# Patient Record
Sex: Female | Born: 1935 | Race: White | Hispanic: No | Marital: Married
Health system: Midwestern US, Academic
[De-identification: ages and names within clinical notes are randomized; demographics above are authoritative.]

---

## 2016-10-05 MED ORDER — LEVETIRACETAM 500 MG PO TB24
ORAL_TABLET | Freq: Every day | ORAL | 4 refills | 90.00000 days | Status: DC
Start: 2016-10-05 — End: 2017-04-25

## 2017-04-25 ENCOUNTER — Encounter: Admit: 2017-04-25 | Discharge: 2017-04-25 | Payer: MEDICARE

## 2017-04-25 ENCOUNTER — Ambulatory Visit: Admit: 2017-04-25 | Discharge: 2017-04-26 | Payer: MEDICARE

## 2017-04-25 DIAGNOSIS — C189 Malignant neoplasm of colon, unspecified: ICD-10-CM

## 2017-04-25 DIAGNOSIS — G40909 Epilepsy, unspecified, not intractable, without status epilepticus: Principal | ICD-10-CM

## 2017-04-25 MED ORDER — LEVETIRACETAM 500 MG PO TB24
500 mg | ORAL_TABLET | Freq: Every day | ORAL | 3 refills | 90.00000 days | Status: AC
Start: 2017-04-25 — End: 2018-05-15

## 2017-04-25 NOTE — Progress Notes
Subjective:       History of Present Illness  Alicia Kramer is a 82 y.o. female who is here today for follow up of undefined epilepsy. Since the last visit 02/18/2016, she continues to be seizure free on levetiracetam XR 500mg  qHS.  She is pleased with her care and denies any medication side effects.    MEDICATION SIDE EFFECTS:  None    REVIEW OF LABS/IMAGING:  1. [place] EEG (08/29/14)???- normal (report reviewed)  2. 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, both of unclear clinical significance???(imaging reviewed)       Review of Systems  The following systems were reviewed and were negative except as mentioned elsewhere:      Constitutional:  No fever/chills/sweat, weight loss, tiredness/fatigue, or poor appetite  Eyes:  No reduced vision or blurriness, double vision, or droopy eyelids  ENT:  No hearing loss, ringing in the ears, vertigo, or hoarseness  Cardiovascular:  No chest pain/angina or palpitations  Respiratory:  No shortness of breath or cough  Gastrointestinal:  No abdominal pain, nausea and/or vomiting, diarrhea, or constipation  Genitourinary:  No pain with urination, excessive urination, or incontinence  Musculoskeletal:  No back pain, neck pain, joint pain/redness/swelling, or myalgia  Skin:  No jaundice, rash, or change in sweating  Hematologic/ Lymphatic:  No anemia, easy bruising, bleeding, or enlarged lymph nodes  Endocrine:  No temperature intolerance, polyuria, or polydipsia  Allergic/ Immunological:  No severe allergic reaction  Psychiatric:  No depression or anxiety    Objective:         ??? CALCIUM PO Take 600 mg by mouth daily.   ??? levETIRAcetam XR(+) (KEPPRA XR) 500 mg Tb24 tablet TAKE TWO TABLETS BY MOUTH ONCE DAILY   ??? MULTIVITAMINS WITH FLUORIDE (MULTI-VITAMIN PO) Take  by mouth daily.   ??? other medication 1 Dose. I Caps BID   ??? vit A/C/E ac/ZnOx/cupric oxide (EYE VITAMIN AND MINERALS PO) Take  by mouth daily.     Vitals: 04/25/17 1005 04/25/17 1009   BP: 167/76 162/65   Pulse: 71    SpO2: 98%    Weight: 70.7 kg (155 lb 12.8 oz)    Height: 154.9 cm (61)      Body mass index is 29.44 kg/m???.     Physical Exam  The patient is pleasant and cooperative with the exam.  She is not in acute distress.  The head is normocephalic without evidence of previous trauma.  Oropharynx is clear.  Lungs are clear to auscultation bilaterally.  Cardiac exam reveals a regular rate and rhythm.  Abdomen is soft with active bowel sounds.  Pulses are preserved in all four extremities.  Skin exhibits normal temperature and showed no rash.    Mental Status: The patient is attentive, alert, and oriented to person, time and place.  Language comprehension, naming, concentration, and recent/remote memory are intact.  Speech is fluent.    Cranial Nerves: Pupils are equal and reactive to light bilaterally.  Visual fields are intact to confrontation testing.  There is no papilledema.   Extraocular movements are intact without nystagmus.  Facial sensation and strength are normal.  Hearing is intact to finger rub.  Tongue and uvular are in the midline.  Shoulder shrug is normal bilaterally.      Motor: Muscle bulk is normal.  Tone is normal in all extremities.  Motor power is normal in the bilateral deltoids, biceps, triceps, wrist extensors, grip, iliopsoas, quads, hamstrings,  gastrocnemius, and tibialis anterior.  There is no pronator drift or fixation.  Tendon reflexes are 2+ diffusely.  Babinski maneuver elicits flexor response bilaterally.     Sensory: Sensation is intact to pinprick, vibration, and light touch.    Cerebellar:  Finger to nose test shows no dysmetria.  Rapid alternating movements are normal.  There is no abnormal movement.      Gait: Normal stance, arm swing, and stride length.  Patient is able to toe, heel, and tandem walk.  No ataxia is noted.       Assessment and Plan:  CLINICAL SUMMARY Ms. Tedesco is a pleasant 82 year old woman with well controlled undefined epilepsy.  We will continue the levetiracetam XR 500mg  qHS.    EPILEPSY CLASSIFICATION  Epileptogenic zone:                  Unknown  Epileptic Seizures:                    Generalized tonic clonic seizure  Etiology:                                     Unknown  Related Medical Conditions:    None    MANAGEMENT AND PLAN  During the visit I discussed my impression, recommended diagnostic studies, prognosis, risks and benefits of management, instructions for management, and importance of compliance.  After a discussion, the patient agrees with the plan.  Total time for this office visit was 15 minutes, of which 10 minutes was spent counseling the patient in regarding her undefined epilepsy.    RECOMMENDATIONS  1.  Continue levetiracetam XR 500mg  qHS    The patient is instructed not to drive unless 6 months free of alteration of consciousness, not to work in close proximity of machines with moving parts, not to swim unsupervised or to work at high places. The patient is to shower (without accumulation of water) instead of taking a bath if unsupervised.    FOLLOWUP PLAN  I plan to see the patient back for follow-up in approximately 12 months.  The patient is instructed to contact us if there is an exacerbation of the seizures or any side effects from the antiepileptic medications.

## 2017-04-26 DIAGNOSIS — G40909 Epilepsy, unspecified, not intractable, without status epilepticus: Principal | ICD-10-CM

## 2017-08-05 ENCOUNTER — Encounter: Admit: 2017-08-05 | Discharge: 2017-08-05 | Payer: MEDICARE

## 2017-08-05 MED ORDER — LEVETIRACETAM 500 MG PO TB24
ORAL_TABLET | Freq: Every day | 4 refills
Start: 2017-08-05 — End: ?

## 2017-11-21 ENCOUNTER — Encounter: Admit: 2017-11-21 | Discharge: 2017-11-21 | Payer: MEDICARE

## 2018-02-24 ENCOUNTER — Encounter: Admit: 2018-02-24 | Discharge: 2018-02-24 | Payer: MEDICARE

## 2018-02-24 DIAGNOSIS — G40909 Epilepsy, unspecified, not intractable, without status epilepticus: Principal | ICD-10-CM

## 2018-02-24 DIAGNOSIS — N952 Postmenopausal atrophic vaginitis: ICD-10-CM

## 2018-02-24 DIAGNOSIS — R569 Unspecified convulsions: ICD-10-CM

## 2018-02-24 DIAGNOSIS — N816 Rectocele: ICD-10-CM

## 2018-02-24 DIAGNOSIS — K635 Polyp of colon: ICD-10-CM

## 2018-02-24 DIAGNOSIS — C189 Malignant neoplasm of colon, unspecified: Secondary | ICD-10-CM

## 2018-02-24 DIAGNOSIS — N8110 Cystocele, unspecified: Principal | ICD-10-CM

## 2018-02-24 DIAGNOSIS — N393 Stress incontinence (female) (male): ICD-10-CM

## 2018-02-24 MED ORDER — ESTRADIOL 0.01 % (0.1 MG/GRAM) VA CREA
VAGINAL | 0 refills | 43.00000 days | Status: AC
Start: 2018-02-24 — End: 2018-03-03

## 2018-03-03 ENCOUNTER — Encounter: Admit: 2018-03-03 | Discharge: 2018-03-03 | Payer: MEDICARE

## 2018-03-03 MED ORDER — ESTRADIOL 0.01 % (0.1 MG/GRAM) VA CREA
VAGINAL | 0 refills | 43.00000 days | Status: AC
Start: 2018-03-03 — End: 2018-07-11

## 2018-05-08 ENCOUNTER — Encounter: Admit: 2018-05-08 | Discharge: 2018-05-08 | Payer: MEDICARE

## 2018-05-13 ENCOUNTER — Encounter: Admit: 2018-05-13 | Discharge: 2018-05-13 | Payer: MEDICARE

## 2018-05-15 MED ORDER — LEVETIRACETAM 500 MG PO TB24
ORAL_TABLET | Freq: Every day | ORAL | 1 refills | 90.00000 days | Status: AC
Start: 2018-05-15 — End: 2018-10-18

## 2018-07-11 ENCOUNTER — Encounter: Admit: 2018-07-11 | Discharge: 2018-07-11 | Payer: MEDICARE

## 2018-07-11 ENCOUNTER — Ambulatory Visit: Admit: 2018-07-11 | Discharge: 2018-07-12 | Payer: MEDICARE

## 2018-07-11 DIAGNOSIS — K635 Polyp of colon: ICD-10-CM

## 2018-07-11 DIAGNOSIS — C189 Malignant neoplasm of colon, unspecified: Secondary | ICD-10-CM

## 2018-07-11 DIAGNOSIS — R569 Unspecified convulsions: ICD-10-CM

## 2018-07-11 DIAGNOSIS — G40909 Epilepsy, unspecified, not intractable, without status epilepticus: Principal | ICD-10-CM

## 2018-07-11 MED ORDER — TROSPIUM 60 MG PO CP24
60 mg | ORAL_CAPSULE | Freq: Every day | ORAL | 3 refills | 30.00000 days | Status: AC
Start: 2018-07-11 — End: 2018-07-13

## 2018-07-11 MED ORDER — ESTRADIOL 0.01 % (0.1 MG/GRAM) VA CREA
VAGINAL | 1 refills | 43.00000 days | Status: AC
Start: 2018-07-11 — End: ?

## 2018-07-11 NOTE — Progress Notes
Smokeless Tobacco Never Used      Social History     Substance and Sexual Activity   Drug Use No       Family History:  Family History   Problem Relation Age of Onset   ??? Cancer Mother    ??? Coronary Artery Disease Mother    ??? Cancer Father    ??? Coronary Artery Disease Father    ??? Stroke Father    ??? Seizures Neg Hx    ??? Dementia Neg Hx        Review of Systems:    Review of Systems   Constitutional: Negative for activity change, appetite change, chills, diaphoresis, fatigue, fever and unexpected weight change.   HENT: Negative for congestion, hearing loss, mouth sores and sinus pressure.    Eyes: Negative for visual disturbance.   Respiratory: Negative for apnea, cough, chest tightness and shortness of breath.    Cardiovascular: Negative for chest pain, palpitations and leg swelling.   Gastrointestinal: Negative for abdominal pain, blood in stool, constipation, diarrhea, nausea, rectal pain and vomiting.   Genitourinary:        See HPI   Musculoskeletal: Negative for arthralgias, back pain, gait problem and myalgias.   Skin: Negative for rash and wound.   Neurological: Negative for dizziness, tremors, seizures, syncope, weakness, light-headedness, numbness and headaches.   Hematological: Negative for adenopathy. Does not bruise/bleed easily.   Psychiatric/Behavioral: Negative for decreased concentration and dysphoric mood. The patient is not nervous/anxious.        Physical Exam:  Vitals:    07/11/18 0834   BP: (!) 141/71   Pulse: 70       Gen: well-developed, NAD  HEENT: normocephalic, conjuctiva clear, mucous membranes moist  Respiratory: normal respiratory effort with symmetric chest expansion  Skin: Warm, No jaundice  Musculoskeletal: No edema, no gross deformity  Neuro: Awake, alert, no focal deficits, normal gait  Psychiatric: Normal affect  GU:  Physiologic vaginal discharge, vaginal walls intact    Assessment/Plan:    83yF with stage 2 ant/post prolapse and MUI managed with pessary with knob. She is having bothersome urgency incontinence. Reviewed behavioral therapies. She would like to try medical therapy.   Rx for trospium provided. Discussed potential side effects of anticholinergic medications including dry mouth, dry eyes, constipation, rare urinary retention, and possible association with increased risk of falls and delirium.      Orders Placed This Encounter   ??? estradioL (ESTRACE) 0.01 % (0.1 mg/g) vaginal cream   ??? trospium XR (SANCTURA XR) 60 mg capsule     1. Vaginal prolapse     2. Mixed stress and urge urinary incontinence         Return in about 3 months (around 10/09/2018) for pessary change.        Venetia Maxon, MD

## 2018-07-12 DIAGNOSIS — N8110 Cystocele, unspecified: Principal | ICD-10-CM

## 2018-07-12 DIAGNOSIS — N3946 Mixed incontinence: ICD-10-CM

## 2018-07-13 ENCOUNTER — Encounter: Admit: 2018-07-13 | Discharge: 2018-07-13 | Payer: MEDICARE

## 2018-07-13 MED ORDER — TROSPIUM 60 MG PO CP24
60 mg | ORAL_CAPSULE | Freq: Every day | ORAL | 3 refills | 30.00000 days | Status: AC
Start: 2018-07-13 — End: 2018-10-17

## 2018-10-17 ENCOUNTER — Encounter: Admit: 2018-10-17 | Discharge: 2018-10-17

## 2018-10-17 ENCOUNTER — Ambulatory Visit: Admit: 2018-10-17 | Discharge: 2018-10-18

## 2018-10-17 DIAGNOSIS — G40909 Epilepsy, unspecified, not intractable, without status epilepticus: Secondary | ICD-10-CM

## 2018-10-17 DIAGNOSIS — C189 Malignant neoplasm of colon, unspecified: Secondary | ICD-10-CM

## 2018-10-17 DIAGNOSIS — R569 Unspecified convulsions: Secondary | ICD-10-CM

## 2018-10-17 DIAGNOSIS — K635 Polyp of colon: Secondary | ICD-10-CM

## 2018-10-17 MED ORDER — TROSPIUM 20 MG PO TAB
20 mg | ORAL_TABLET | Freq: Two times a day (BID) | ORAL | 3 refills | 30.00000 days | Status: DC
Start: 2018-10-17 — End: 2019-10-30

## 2018-10-17 NOTE — Progress Notes
Pessary was removed and cleaned, replaced with no problems or concerns.

## 2018-10-18 ENCOUNTER — Encounter: Admit: 2018-10-18 | Discharge: 2018-10-18

## 2018-10-18 DIAGNOSIS — G40909 Epilepsy, unspecified, not intractable, without status epilepticus: Secondary | ICD-10-CM

## 2018-10-18 DIAGNOSIS — R569 Unspecified convulsions: Secondary | ICD-10-CM

## 2018-10-18 DIAGNOSIS — N814 Uterovaginal prolapse, unspecified: Principal | ICD-10-CM

## 2018-10-18 DIAGNOSIS — C189 Malignant neoplasm of colon, unspecified: Secondary | ICD-10-CM

## 2018-10-18 DIAGNOSIS — N3941 Urge incontinence: Secondary | ICD-10-CM

## 2018-10-18 DIAGNOSIS — K635 Polyp of colon: Secondary | ICD-10-CM

## 2018-10-18 MED ORDER — LEVETIRACETAM 500 MG PO TB24
500 mg | ORAL_TABLET | Freq: Every day | ORAL | 3 refills | 90.00000 days | Status: DC
Start: 2018-10-18 — End: 2019-11-12

## 2018-10-18 NOTE — Progress Notes
COVID-19 TELEHEALTH VISIT    History of Present Illness  I last saw Alicia Kramer on 04/25/2017 for undefined epilepsy.  When I spoke with Alicia Kramer, she tells me she continues to be seizure free.  She denies issues with her levetiracetam.    Previous tests  1. [facility] EEG (08/29/14)???- normal (report reviewed)  2.  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, both of unclear clinical significance???(imaging reviewed)    Review of systems  The following systems were reviewed and were negative except as mentioned elsewhere:      Constitutional:  No fever/chills/sweat, weight loss, tiredness/fatigue, or poor appetite  Eyes:  No reduced vision or blurriness, double vision, or droopy eyelids  ENT:  No hearing loss, ringing in the ears, vertigo, or hoarseness  Cardiovascular:  No chest pain/angina or palpitations  Respiratory:  No shortness of breath or cough  Gastrointestinal:  No abdominal pain, nausea and/or vomiting, diarrhea, or constipation  Genitourinary:  No pain with urination, excessive urination, or incontinence  Musculoskeletal:  No back pain, neck pain, joint pain/redness/swelling, or myalgia  Skin:  No jaundice, rash, or change in sweating  Hematologic/ Lymphatic:  No anemia, easy bruising, bleeding, or enlarged lymph nodes  Endocrine:  No temperature intolerance, polyuria, or polydipsia  Allergic/ Immunological:  No severe allergic reaction  Psychiatric:  No depression or anxiety    Current Outpatient Medications on File Prior to Visit   Medication Sig Dispense Refill   ??? aspirin EC 81 mg tablet Take 81 mg by mouth daily. Take with food.     ??? CALCIUM PO Take 600 mg by mouth daily.     ??? estradioL (ESTRACE) 0.01 % (0.1 mg/g) vaginal cream Insert or Apply  to vaginal area twice weekly. Insert pea-sized amount into vagina 2 nights a week. 42.5 g 1   ??? levETIRAcetam XR (KEPPRA XR) 500 mg Tb24 tablet TAKE 1 TABLET BY MOUTH ONCE DAILY 90 tablet 1 ??? MULTIVITAMINS WITH FLUORIDE (MULTI-VITAMIN PO) Take  by mouth daily.     ??? other medication 1 Dose. I Caps BID     ??? trospium (SANCTURA) 20 mg tablet Take one tablet by mouth twice daily before meals. 60 tablet 3     No current facility-administered medications on file prior to visit.      Impression  CLINICAL SUMMARY   Alicia Kramer is a pleasant 84 year old woman who remains seizure free on levetiracetam XR which we will continue.    EPILEPSY CLASSIFICATION  Epileptogenic zone:   Unknown  Seizure semiology: Bilateral tonic clonic seizure  Etiology:  Unknown  Comorbidities:    None    MANAGEMENT AND PLAN  During the visit I discussed my impression, recommended diagnostic studies, prognosis, risks and benefits of management, instructions for management, and importance of compliance.  After a discussion, the patient agrees with the plan.  Total time for this telehealth visit was 15 minutes, of which 10 minutes was spent counseling the patient in regarding her epilepsy.    RECOMMENDATIONS  1.  Continue levetiracetam XR 500mg  daily     The patient is instructed not to drive unless 6 months free of alteration of consciousness, not to work in close proximity of machines with moving parts, not to swim unsupervised or to work at high places. The patient is to shower (without accumulation of water) instead of taking a bath if unsupervised.    FOLLOWUP PLAN  I plan to see the patient  back for follow-up in approximately 18 months.  The patient is instructed to contact us if there is an exacerbation of the seizures or any side effects from the antiepileptic medications.    This is a telemedicine visit that was performed with the originating site at [facility] and the distant site at her home. Verbal consent to participate in telephone visit was obtained. This visit occurred during the Coronavirus (COVID-19) Public Health Emergency. I discussed with the patient the nature of our telemedicine visits, that: ??? I would evaluate the patient and recommend diagnostics and treatments based on my assessment  ??? Our sessions are not being recorded and that personal health information is protected  ??? Our team would provide follow up care in person if/when the patient needs it    Obtained patient's verbal consent to treat them and their agreement to [facility] financial policy and NPP via this telehealth visit during the [facility] Emergency

## 2018-10-19 ENCOUNTER — Ambulatory Visit: Admit: 2018-10-18 | Discharge: 2018-10-19

## 2018-10-19 DIAGNOSIS — G40909 Epilepsy, unspecified, not intractable, without status epilepticus: Principal | ICD-10-CM

## 2018-10-23 NOTE — Progress Notes
Urology    CC:  Follow Up      HPI:  Alicia Kramer is a 84 y.o. female with PMH significant for  h/o colon cancer s/p colectomy, seizure disorder who is followed at [facility] for uterine prolapse and mixed urinary incontinence.    02/2018 Exam: stage 2 anterior (+1) and posterior (-1) prolapse with uterine descent. PVR = 6 mL  Plan: pessary with support and knob  TVE 2x/week    Today, 10/17/2018, continues to have urgency incontinence. Did not start medical therapy. No vaginal bleeding. No UTIs. She used TVE twice a couple of weeks ago and then developed lower back pain which she thought was due to the TVE. However she has since used it once and did not have the pain.    Pessary removed and cleaned by Chapman Moss, MA.      PMH:  Medical History:   Diagnosis Date   ??? Cancer of colon (HCC)    ??? Colon cancer [facility])     Colon s/p colectomy   ??? Colon polyp    ??? Epilepsy (HCC)    ??? Seizure (HCC)         PSH:  Surgical History:   Procedure Laterality Date   ??? CATARACT REMOVAL Bilateral    ??? CATARACT REMOVAL     ??? GALLBLADDER SURGERY     ??? HX APPENDECTOMY     ??? HX COLON SURGERY      CA removal       Medications:  Current Outpatient Medications   Medication Sig Dispense Refill   ??? aspirin EC 81 mg tablet Take 81 mg by mouth daily. Take with food.     ??? CALCIUM PO Take 600 mg by mouth daily.     ??? estradioL (ESTRACE) 0.01 % (0.1 mg/g) vaginal cream Insert or Apply  to vaginal area twice weekly. Insert pea-sized amount into vagina 2 nights a week. 42.5 g 1   ??? levETIRAcetam XR (KEPPRA XR) 500 mg Tb24 tablet Take one tablet by mouth daily. 90 tablet 3   ??? MULTIVITAMINS WITH FLUORIDE (MULTI-VITAMIN PO) Take  by mouth daily.     ??? other medication 1 Dose. I Caps BID     ??? trospium (SANCTURA) 20 mg tablet Take one tablet by mouth twice daily before meals. 60 tablet 3       Allergies:  Patient has no known allergies.    Social History:  Social History     Substance and Sexual Activity   Alcohol Use Never ??? Frequency: Never      Social History     Tobacco Use   Smoking Status Never Smoker   Smokeless Tobacco Never Used      Social History     Substance and Sexual Activity   Drug Use Never       Family History:  Family History   Problem Relation Age of Onset   ??? Cancer Mother    ??? Coronary Artery Disease Mother    ??? Cancer Father    ??? Coronary Artery Disease Father    ??? Stroke Father    ??? Seizures Neg Hx    ??? Dementia Neg Hx        Review of Systems:    Review of Systems   Constitutional: Negative for activity change, appetite change, chills, diaphoresis, fatigue, fever and unexpected weight change.   HENT: Negative for congestion, hearing loss, mouth sores and sinus pressure.    Eyes: Negative  for visual disturbance.   Respiratory: Negative for apnea, cough, chest tightness and shortness of breath.    Cardiovascular: Negative for chest pain, palpitations and leg swelling.   Gastrointestinal: Negative for abdominal pain, blood in stool, constipation, diarrhea, nausea, rectal pain and vomiting.   Genitourinary:        See HPI   Musculoskeletal: Negative for arthralgias, back pain, gait problem and myalgias.   Skin: Negative for rash and wound.   Neurological: Negative for dizziness, tremors, seizures, syncope, weakness, light-headedness, numbness and headaches.   Hematological: Negative for adenopathy. Does not bruise/bleed easily.   Psychiatric/Behavioral: Negative for decreased concentration and dysphoric mood. The patient is not nervous/anxious.        Physical Exam:  Vitals:    10/17/18 1043   BP: (!) 151/82   Pulse: 76       Gen: well-developed, NAD  HEENT: normocephalic, conjuctiva clear, mucous membranes moist  Respiratory: normal respiratory effort with symmetric chest expansion  Skin: Warm, No jaundice  Musculoskeletal: No edema, no gross deformity  Neuro: Awake, alert, no focal deficits, normal gait  Psychiatric: Normal affect    Assessment/Plan:    83yF with stage 2 ant/post prolapse and MUI managed with pessary with knob.    Will try trospium 20mg  po BID. Discussed potential side effects of anticholinergic medications including dry mouth, dry eyes, constipation, rare urinary retention  Encouraged her to continue using TVE as this is unlikely to be the cause of her lower back pain    Orders Placed This Encounter   ??? trospium (SANCTURA) 20 mg tablet     1. Uterine prolapse     2. Urge incontinence of urine         Return in about 3 months (around 01/17/2019).        Venetia Maxon, MD

## 2019-01-23 ENCOUNTER — Encounter: Admit: 2019-01-23 | Discharge: 2019-01-23

## 2019-01-23 ENCOUNTER — Ambulatory Visit: Admit: 2019-01-23 | Discharge: 2019-01-24

## 2019-01-23 DIAGNOSIS — C189 Malignant neoplasm of colon, unspecified: Secondary | ICD-10-CM

## 2019-01-23 DIAGNOSIS — G40909 Epilepsy, unspecified, not intractable, without status epilepticus: Secondary | ICD-10-CM

## 2019-01-23 DIAGNOSIS — N814 Uterovaginal prolapse, unspecified: Secondary | ICD-10-CM

## 2019-01-23 DIAGNOSIS — K635 Polyp of colon: Secondary | ICD-10-CM

## 2019-01-23 DIAGNOSIS — R569 Unspecified convulsions: Secondary | ICD-10-CM

## 2019-01-23 NOTE — Progress Notes
Pessary was removed and cleaned, replaced with no problems or concerns.     Performed by Crystal Hopper, MA      I was immediately available during the procedure.   Rawn Quiroa, MD

## 2019-04-24 ENCOUNTER — Encounter: Admit: 2019-04-24 | Discharge: 2019-04-24 | Payer: MEDICARE

## 2019-04-24 ENCOUNTER — Ambulatory Visit: Admit: 2019-04-24 | Discharge: 2019-04-25 | Payer: MEDICARE

## 2019-04-24 DIAGNOSIS — K635 Polyp of colon: Secondary | ICD-10-CM

## 2019-04-24 DIAGNOSIS — G40909 Epilepsy, unspecified, not intractable, without status epilepticus: Secondary | ICD-10-CM

## 2019-04-24 DIAGNOSIS — C189 Malignant neoplasm of colon, unspecified: Secondary | ICD-10-CM

## 2019-04-24 DIAGNOSIS — R569 Unspecified convulsions: Secondary | ICD-10-CM

## 2019-07-24 ENCOUNTER — Encounter: Admit: 2019-07-24 | Discharge: 2019-07-24 | Payer: MEDICARE

## 2019-07-24 ENCOUNTER — Ambulatory Visit: Admit: 2019-07-24 | Discharge: 2019-07-25 | Payer: MEDICARE

## 2019-10-30 ENCOUNTER — Encounter: Admit: 2019-10-30 | Discharge: 2019-10-30 | Payer: MEDICARE

## 2019-10-30 ENCOUNTER — Ambulatory Visit: Admit: 2019-10-30 | Discharge: 2019-10-31 | Payer: MEDICARE

## 2019-10-30 DIAGNOSIS — G40909 Epilepsy, unspecified, not intractable, without status epilepticus: Secondary | ICD-10-CM

## 2019-10-30 DIAGNOSIS — K635 Polyp of colon: Secondary | ICD-10-CM

## 2019-10-30 DIAGNOSIS — C189 Malignant neoplasm of colon, unspecified: Secondary | ICD-10-CM

## 2019-10-30 DIAGNOSIS — R569 Unspecified convulsions: Secondary | ICD-10-CM

## 2019-10-30 DIAGNOSIS — N3946 Mixed incontinence: Secondary | ICD-10-CM

## 2019-10-30 NOTE — Assessment & Plan Note
Alicia Kramer is a 85 y.o. female with history of colon cancer s/p colectomy, seizure disorder, uterine prolapse managed with pessary, and mixed urinary incontinence who presents for follow up. The patient has no concerns regarding pessary use, and is due for a pessary cleaning today. Overall, the patient's urinary symptoms have improved, and the patient does not feel that she needs to use Trospium. Therefore, we will discontinue the medication today. We will have the patient return in three months for pessary care.    Plan:  - Continue Pessary use. Pessary cleaning today  - Discontinue Trospium. Patient will continue to monitor symptoms and discuss if she would like to restart anticholinergic medications at that time.  - Return to clinic in three months

## 2019-11-12 ENCOUNTER — Encounter: Admit: 2019-11-12 | Discharge: 2019-11-12 | Payer: MEDICARE

## 2019-11-12 MED ORDER — LEVETIRACETAM 500 MG PO TB24
500 mg | ORAL_TABLET | Freq: Every day | ORAL | 0 refills | 90.00000 days | Status: AC
Start: 2019-11-12 — End: ?

## 2020-01-22 ENCOUNTER — Encounter: Admit: 2020-01-22 | Discharge: 2020-01-22 | Payer: MEDICARE

## 2020-01-22 ENCOUNTER — Ambulatory Visit: Admit: 2020-01-22 | Discharge: 2020-01-23 | Payer: MEDICARE

## 2020-01-22 DIAGNOSIS — C189 Malignant neoplasm of colon, unspecified: Secondary | ICD-10-CM

## 2020-01-22 DIAGNOSIS — N814 Uterovaginal prolapse, unspecified: Secondary | ICD-10-CM

## 2020-01-22 DIAGNOSIS — G40909 Epilepsy, unspecified, not intractable, without status epilepticus: Secondary | ICD-10-CM

## 2020-01-22 DIAGNOSIS — K635 Polyp of colon: Secondary | ICD-10-CM

## 2020-01-22 DIAGNOSIS — R569 Unspecified convulsions: Secondary | ICD-10-CM

## 2020-01-22 NOTE — Progress Notes
Ring with support and knob pessary was removed and cleaned, replaced with no problems or concerns.?  No vaginal bleeding.    Performed by Ramond Craver, RN      I was immediately available during the procedure.   Britt Bolognese, MD

## 2020-02-01 ENCOUNTER — Encounter: Admit: 2020-02-08 | Discharge: 2020-02-08 | Payer: MEDICARE

## 2020-02-04 ENCOUNTER — Encounter: Admit: 2020-02-04 | Discharge: 2020-02-04 | Payer: MEDICARE

## 2020-02-04 MED ORDER — LEVETIRACETAM 500 MG PO TB24
ORAL_TABLET | Freq: Every day | ORAL | 0 refills | 90.00000 days | Status: AC
Start: 2020-02-04 — End: ?

## 2020-02-08 ENCOUNTER — Ambulatory Visit: Admit: 2020-02-08 | Discharge: 2020-02-09 | Payer: MEDICARE

## 2020-02-08 DIAGNOSIS — G40909 Epilepsy, unspecified, not intractable, without status epilepticus: Secondary | ICD-10-CM

## 2020-02-08 MED ORDER — LEVETIRACETAM 500 MG PO TB24
500 mg | ORAL_TABLET | Freq: Every day | ORAL | 3 refills | 90.00000 days | Status: AC
Start: 2020-02-08 — End: ?

## 2020-02-08 NOTE — Progress Notes
Tele-Health Progress Note- converted to phone call    Obtained patient's verbal consent to treat them and their agreement to [facility] financial policy and NPP via this telehealth visit during the Coronavirus Public Health Emergency    Patient Name: Alicia Kramer  Age: 85 y.o.  Sex: female    Encounter Date: 02/08/2020  Date of tele-health visit:02/08/20  MRN: [id number]  PCP: Lona Kettle     Reason for visit/ chief complaint:  Seizure/epilepsy follow up     INTERVAL SUMMARY        Alicia Kramer last visit was on 10/18/2018 with Dr. Ardelle Lesches. No seizures for the last few years. Was diagnosed with HTN in December 2020, but otherwise no significant changes were reported.        REVIEW OF SYSTEMS     Review of Systems:   A 10-point ROS is negative with the exception of pertinent positives as noted above in the HPI.       Previously documented HPI (copied and reviewed)     History of Present Illness  EVENT DESCRIPTION  Event onset:  July 26, 2012  ?  Event description: The event occurred out of sleep.  She does not remember anything about this.  Her husband noted that she was breathing strangely.  He also noted that she was drooling out of the side of her mouth.  He noticed that she had urinated on herself.  He tried to wake her up and she woke up, but wasn't really there.  She wasn't really moving her arms and legs at that time.  At that time, they called 911.  The paramedics arrived and tried to insert an IV.  At that time, she started being combative.  She was taken to the hospital.  She was told she had a seizure and she was discharged.  She went and saw Dr. Marlyne Beards (their PCP) who agreed it was a seizure and set up follow up for here.  ?  Of note, she took docosahexaenoic acid (DHA) the night before.  ?  Event duration: Unknown  ?  Event frequency: One on 07/26/12  ?  Precipitating factors: She did take the DHA the night before, but no other factors noted.  ?  Previous AEDs:  None  ?  Other history: I reviewed the ED note from [facility].  It was pertinent for a suspected diagnosis of seizure.  By the time Alicia Kramer arrived at the hospital, she was back to her baseline.  There was a brief episode of nausea that lasted 10 seconds as well.  ?  Epilepsy risk factors (including gestational/birth history):  The patient was the product of a normal spontaneous vaginal delivery of a full term birth.    There was no history of febrile convulsions or CNS infections.    Development was normal and developmental milestones were up to par with age.    No history of head trauma with loss of consciousness.   There are no other risk factors for epilepsy.        PAST MEDICAL HISTORY     Medical History:   Diagnosis Date   ? Cancer of colon (HCC)    ? Colon cancer [facility])     Colon s/p colectomy   ? Colon polyp    ? Epilepsy (HCC)    ? Seizure [facility])        Patient Active Problem List    Diagnosis Date Noted   ? 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 CATARACT REMOVAL Bilateral    ? CATARACT REMOVAL     ? GALLBLADDER SURGERY     ? HX APPENDECTOMY     ? HX COLON SURGERY      CA removal       FAMILY HISTORY     Family History   Problem Relation Age of Onset   ? Cancer Mother    ? Coronary Artery Disease Mother    ? Cancer Father    ? Coronary Artery Disease Father    ? Stroke Father    ? Seizures Neg Hx    ? Dementia Neg Hx        SOCIAL HISTORY     Social History     Socioeconomic History   ? Marital status: Married     Spouse name: Not on file   ? Number of children: Not on file   ? Years of education: Not on file   ? Highest education level: Not on file   Occupational History   ? Not on file   Tobacco Use   ? Smoking status: Never Smoker   ? Smokeless tobacco: Never Used   Substance and Sexual Activity   ? Alcohol use: Never   ? Drug use: Never   ? Sexual activity: Not on file   Other Topics Concern   ? Not on file   Social History Narrative   ? Not on file       Social History     Substance and Sexual Activity   Drug Use Never         Social History     Tobacco Use   Smoking Status Never Smoker   Smokeless Tobacco Never Used       MEDICATION     Current Outpatient Medications   Medication Sig Dispense Refill   ? CALCIUM PO Take 600 mg by mouth daily.     ? estradioL (ESTRACE) 0.01 % (0.1 mg/g) vaginal cream Insert or Apply  to vaginal area twice weekly. Insert pea-sized amount into vagina 2 nights a week. 42.5 g 1   ? levETIRAcetam XR (KEPPRA XR) 500 mg tablet TAKE 1 TABLET BY MOUTH ONCE DAILY  90 tablet 0   ? lisinopriL (ZESTRIL) 5 mg tablet Take 5 mg by mouth daily. Indications: high blood pressure     ? vit A/vit C/vit E/zinc/copper (ICAPS AREDS PO) Take  by mouth daily.       No current facility-administered medications for this visit.       ALLERGIES     No Known Allergies    PHYSICAL EXAMINATION         PHYSICAL EXAM:  limited - phone visit   General:  ? Alicia Kramer is a well-sounding individual in no acute distress.   ? Head:  Head normocephalic, atraumatic.     Neuro:   ? Mental status: Awake and alert.   ? Patient oriented   ? General fund of knowledge and recent and remote memory appear intact based on subjective data/history provided.  ? Speech is fluent with normal articulation.   ? Language has intact comprehension; language content appears appropriate based on our conversation.        STUDIES REVIEWED     No head imaging reviewed.     IMPRESSION/PLAN   Summary/Impression:  Alicia Kramer is a 85 y.o. female with undefined epilepsy well controlled with keppra.          Plan:      ? Continue AED regimen as follows:  ? Keppra XR 500 mg nightly- refills provided.     ? Follow up with Dr. Ardelle Lesches in 12 months or sooner if needed.         Approximately 25 minutes were spent with the patient of which more than 50% were spent in preparing to see the patient, counseling and educating the patient/family member/caregiver, and/or coordination of care on 02/08/20. The patient understands and agrees with the plan of care.        Electronically signed by  Alicia Melena, APRN-NP   Epilepsy APP  [facility]  [facility]  on 02/08/2020 at 1:05 PM.

## 2020-02-16 IMAGING — CR CHEST
1 series · 1 of 1 positions shown · non-contrast
Comparison: none

[t-spine ap]
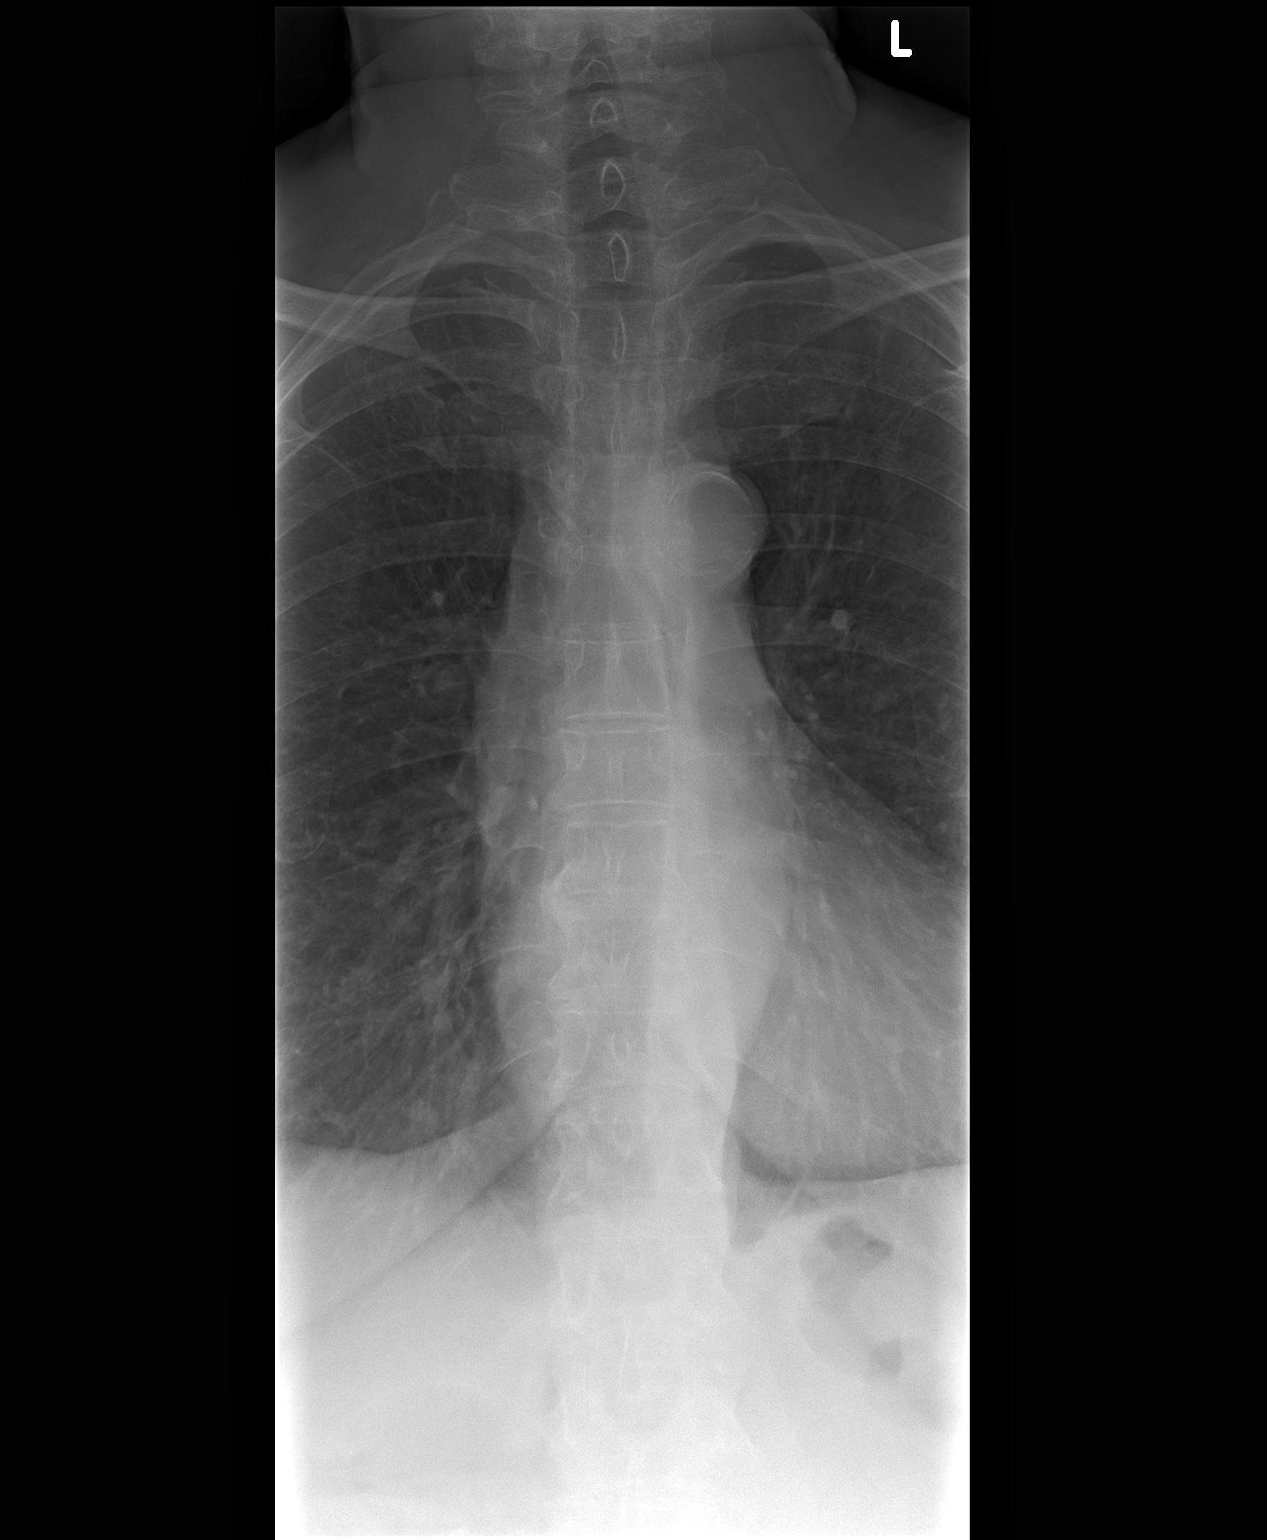

[1 of 1 positions shown; findings below may reference images not displayed]

DIAGNOSTIC STUDIES

EXAM

RADIOLOGICAL EXAMINATION, CHEST; 2 VIEWS FRONTAL AND LATERAL CPT 92373, thoracic spine:

INDICATION

back pain
pt c/o chronic cough   back pain at night when laying down. no known injury. jc/cp

TECHNIQUE

Frontal and lateral views of the chest were performed.

COMPARISONS

None

FINDINGS

Chest two view: Mild prominence of the cardiac silhouette. Mild bilateral hilar prominence. Mild
peribronchial thickening. No dense consolidation.

Thoracic spine: No scoliosis. The pedicles appear intact. Mild multilevel degenerative changes.

IMPRESSION

Chest: Mild peribronchial thickening. No dense consolidation.

Thoracic spine: Mild multilevel degenerative changes.

Tech Notes:

pt c/o chronic cough   back pain at night when laying down. no known injury. jc/cp

## 2020-04-15 ENCOUNTER — Encounter: Admit: 2020-04-15 | Discharge: 2020-04-15 | Payer: MEDICARE

## 2020-04-15 ENCOUNTER — Ambulatory Visit: Admit: 2020-04-15 | Discharge: 2020-04-16 | Payer: MEDICARE

## 2020-04-15 DIAGNOSIS — K635 Polyp of colon: Secondary | ICD-10-CM

## 2020-04-15 DIAGNOSIS — C189 Malignant neoplasm of colon, unspecified: Secondary | ICD-10-CM

## 2020-04-15 DIAGNOSIS — G40909 Epilepsy, unspecified, not intractable, without status epilepticus: Secondary | ICD-10-CM

## 2020-04-15 DIAGNOSIS — R569 Unspecified convulsions: Secondary | ICD-10-CM

## 2020-07-11 ENCOUNTER — Encounter: Admit: 2020-07-11 | Discharge: 2020-07-11 | Payer: MEDICARE

## 2020-07-11 MED ORDER — LEVETIRACETAM 500 MG PO TB24
500 mg | ORAL_TABLET | Freq: Every day | ORAL | 2 refills | 90.00000 days | Status: AC
Start: 2020-07-11 — End: ?

## 2020-07-29 ENCOUNTER — Encounter: Admit: 2020-07-29 | Discharge: 2020-07-29 | Payer: MEDICARE

## 2020-07-29 ENCOUNTER — Ambulatory Visit: Admit: 2020-07-29 | Discharge: 2020-07-30 | Payer: MEDICARE

## 2020-07-29 DIAGNOSIS — N814 Uterovaginal prolapse, unspecified: Secondary | ICD-10-CM

## 2020-07-29 NOTE — Progress Notes
Ring with support and knob pessary was removed and cleaned, replaced with no problems or concerns.   No vaginal bleeding.     Performed by Donia Ast      I was immediately available during the procedure.   Theadora Rama, MD

## 2020-11-18 ENCOUNTER — Encounter: Admit: 2020-11-18 | Discharge: 2020-11-18 | Payer: MEDICARE

## 2020-11-18 ENCOUNTER — Ambulatory Visit: Admit: 2020-11-18 | Discharge: 2020-11-19 | Payer: MEDICARE

## 2020-11-18 DIAGNOSIS — K635 Polyp of colon: Secondary | ICD-10-CM

## 2020-11-18 DIAGNOSIS — N3946 Mixed incontinence: Secondary | ICD-10-CM

## 2020-11-18 DIAGNOSIS — C189 Malignant neoplasm of colon, unspecified: Secondary | ICD-10-CM

## 2020-11-18 DIAGNOSIS — R569 Unspecified convulsions: Secondary | ICD-10-CM

## 2020-11-18 DIAGNOSIS — N814 Uterovaginal prolapse, unspecified: Secondary | ICD-10-CM

## 2020-11-18 DIAGNOSIS — G40909 Epilepsy, unspecified, not intractable, without status epilepticus: Secondary | ICD-10-CM

## 2020-11-18 NOTE — Progress Notes
Urology    CC:  Chief Complaint   Patient presents with   ? Follow Up       HPI:  Alicia Kramer is a 86 y.o. female with PMH significant for epilepsy and colon cancer s/p colectomy presenting for follow-up of uterine prolapse w/ pessary change. She was last seen by Dr. Lorenz Coaster on 10/30/19, and has continued to be managed with pessary changes ~q3 months.     She denies pain with pessary. She notes a few episodes over the year of trace vaginal bleeding. For ~a couple of months, she reports occasionally noticing a thin salmon-colored film over stool and reports recent negative colonoscopy.     She continues to use estrace cream twice a week and is no longer using trospium. She expresses concern related to cost of estrace. Her husband was recently hospitalized for ischemic bowel.   She endorses slight urinary leakage, noticeable with position change. She will occasionally also have leakage with cough/sneeze. She drinks at least 3 16oz bottles of water/day. She sometimes drinks right up until bed and reports one to two episodes of nocturia. She occasionally drinks coffee and/or Coke. She wears one pad/day which she does not soak. She does not find current symptoms overtly bothersome.   She denies hematuria, UTIs, dysuria, or other concerns.     PMH:  Medical History:   Diagnosis Date   ? Cancer of colon (HCC)    ? Colon cancer [facility])     Colon s/p colectomy   ? Colon polyp    ? Epilepsy (HCC)    ? Seizure (HCC)         PSH:  Surgical History:   Procedure Laterality Date   ? CATARACT REMOVAL Bilateral    ? CATARACT REMOVAL     ? GALLBLADDER SURGERY     ? HX APPENDECTOMY     ? HX COLON SURGERY      CA removal       Medications:  Current Outpatient Medications   Medication Sig Dispense Refill   ? estradioL (ESTRACE) 0.01 % (0.1 mg/g) vaginal cream Insert or Apply  to vaginal area twice weekly. Insert pea-sized amount into vagina 2 nights a week. 42.5 g 1   ? levETIRAcetam XR (KEPPRA XR) 500 mg tablet Take one tablet by mouth daily. 90 tablet 2   ? losartan (COZAAR) 100 mg tablet Take 100 mg by mouth daily.     ? vit A/C/E ac/ZnOx/cupric oxide (EYE VITAMIN AND MINERALS PO) Take  by mouth.     ? vit A/vit C/vit E/zinc/copper (ICAPS AREDS PO) Take  by mouth daily.         Allergies:  Seasonal allergies    Social History:  Social History     Substance and Sexual Activity   Alcohol Use Never      Social History     Tobacco Use   Smoking Status Never Smoker   Smokeless Tobacco Never Used      Social History     Substance and Sexual Activity   Drug Use Never       Family History:  Family History   Problem Relation Age of Onset   ? Cancer Mother    ? Coronary Artery Disease Mother    ? Cancer Father    ? Coronary Artery Disease Father    ? Stroke Father    ? Seizures Neg Hx    ? Dementia Neg Hx  Physical Exam:  Vitals:    11/18/20 1009   BP: (!) (P) 140/62   Pulse: (P) 76       There is no height or weight on file to calculate BMI.    Gen: well-developed, NAD  HEENT: NC/AT, wearing mask   Respiratory: normal respiratory effort; no increased work of breathing appreciated on RA   CV: Normal rate   Neuro: Awake, alert, converses appropriately   Psychiatric: Normal affect    Assessment/Plan:          86 y.o. female with PMH significant for epilepsy and colon cancer s/p colectomy seen for follow-up of uterine prolapse w/ pessary change. Pessary cleaning completed today with no significant new/additional concerns elicited. If at any point current regimen does not meet suitable quality of life for patient, return sooner than planned below for additional evaluation and recommendations (e.g. behavioral modifications). Otherwise, continue current regimen as pt. Is doing well overall.       1. Uterine prolapse     2. Mixed stress and urge urinary incontinence       ?  Plan:  - Continue Pessary use. Pessary cleaning today  - Encouraged to use [employer] for Estrace Rx         Return in about 3 months (around 02/18/2021) for pessary cleaning- RN visit.    Colette OConnor, MSIV     ATTESTATION    I personally performed the key portions of the E/M visit, discussed case with the Resident and concur with the resident and Medical Student documentation of history, physical exam, assessment, and treatment plan unless otherwise noted.      Staff name:  Britt Bolognese, MD Date:  11/18/2020

## 2020-11-18 NOTE — Progress Notes
Ring with support and knob pessary was removed and cleaned, replaced with no problems or concerns.   No vaginal bleeding.     Performed by Donia Ast, RN

## 2021-02-17 ENCOUNTER — Encounter: Admit: 2021-02-17 | Discharge: 2021-02-17 | Payer: MEDICARE

## 2021-02-17 ENCOUNTER — Ambulatory Visit: Admit: 2021-02-17 | Discharge: 2021-02-17 | Payer: MEDICARE

## 2021-02-17 DIAGNOSIS — G40909 Epilepsy, unspecified, not intractable, without status epilepticus: Secondary | ICD-10-CM

## 2021-02-17 DIAGNOSIS — C189 Malignant neoplasm of colon, unspecified: Secondary | ICD-10-CM

## 2021-02-17 DIAGNOSIS — K635 Polyp of colon: Secondary | ICD-10-CM

## 2021-02-17 DIAGNOSIS — N814 Uterovaginal prolapse, unspecified: Secondary | ICD-10-CM

## 2021-02-17 DIAGNOSIS — R569 Unspecified convulsions: Secondary | ICD-10-CM

## 2021-02-17 MED ORDER — ESTRADIOL 0.01 % (0.1 MG/GRAM) VA CREA
VAGINAL | 1 refills | 43.00000 days | Status: AC
Start: 2021-02-17 — End: ?

## 2021-02-17 NOTE — Progress Notes
Ring with knob and support pessary was removed and cleaned, replaced with no problems or concerns.   No vaginal bleeding.     Performed by  Donia Ast, RN      I was immediately available during the procedure.   Theadora Rama, MD

## 2021-02-24 ENCOUNTER — Encounter: Admit: 2021-02-24 | Discharge: 2021-02-24 | Payer: MEDICARE

## 2021-02-24 ENCOUNTER — Ambulatory Visit: Admit: 2021-02-24 | Discharge: 2021-02-24 | Payer: MEDICARE

## 2021-02-24 DIAGNOSIS — G40909 Epilepsy, unspecified, not intractable, without status epilepticus: Secondary | ICD-10-CM

## 2021-02-24 DIAGNOSIS — C189 Malignant neoplasm of colon, unspecified: Secondary | ICD-10-CM

## 2021-02-24 DIAGNOSIS — K635 Polyp of colon: Secondary | ICD-10-CM

## 2021-02-24 DIAGNOSIS — R569 Unspecified convulsions: Secondary | ICD-10-CM

## 2021-02-24 LAB — COMPREHENSIVE METABOLIC PANEL
ALBUMIN: 4.3 g/dL (ref >60)
ALK PHOSPHATASE: 70 U/L (ref 25–110)
ALT: 13 U/L (ref 7–56)
ANION GAP: 7 (ref 3–12)
AST: 15 U/L (ref 7–40)
BLD UREA NITROGEN: 13 mg/dL — ABNORMAL LOW (ref 7–25)
CALCIUM: 9.3 mg/dL (ref 8.5–10.6)
CHLORIDE: 107 MMOL/L (ref 98–110)
CO2: 28 MMOL/L (ref 21–30)
CREATININE: 0.8 mg/dL (ref 0.4–1.00)
EGFR: >60 mL/min (ref >60)
POTASSIUM: 4.6 MMOL/L (ref 3.5–5.1)
SODIUM: 142 MMOL/L — ABNORMAL HIGH (ref 137–147)
TOTAL BILIRUBIN: 0.6 mg/dL (ref 0.3–1.2)
TOTAL PROTEIN: 6.6 g/dL (ref 6.0–8.0)

## 2021-02-24 LAB — CBC
RBC COUNT: 4.7 M/UL (ref 4.0–5.0)
WBC COUNT: 5.3 K/UL (ref 4.5–11.0)

## 2021-02-24 MED ORDER — LEVETIRACETAM 500 MG PO TB24
500 mg | ORAL_TABLET | Freq: Every day | ORAL | 3 refills | 90.00000 days | Status: AC
Start: 2021-02-24 — End: ?

## 2021-02-24 NOTE — Progress Notes
Epilepsy Clinic Progress Note     Patient Name: Alicia Kramer  Age: 86 y.o.  Sex: female    Encounter Date: 02/24/2021  Date of visit:02/24/21  MRN: [id number]  PCP: Lona Kettle     Reason for visit/ chief complaint:  Seizure/epilepsy follow up     INTERVAL SUMMARY      Loretha Crombie Lamping's last visit was on 02/08/2020 with myself. No seizures. No new neurological concerns at this time. She just took her antihypertensive (increased BP this visit). No changes with her medical hx reported.        REVIEW OF SYSTEMS     Review of Systems:   A 10-point ROS is negative with the exception of pertinent positives as noted above in the HPI.       Previously documented summary (copied and reviewed)     History of Present Illness  EVENT DESCRIPTION  Event onset: ?July 26, 2012  ?  Event description:?The event occurred out of sleep. ?She does not remember anything about this. ?Her husband noted that she was breathing strangely. ?He also noted that she was drooling out of the side of her mouth. ?He noticed that she had urinated on herself. ?He tried to wake her up and she woke up, but wasn't really there. ?She wasn't really moving her arms and legs at that time. ?At that time, they called 911. ?The paramedics arrived and tried to insert an IV. ?At that time, she started being combative. ?She was taken to the hospital. ?She was told she had a seizure and she was discharged. ?She went and saw Dr. Marlyne Beards (their PCP) who agreed it was a seizure and set up follow up for here.  ?  Of note, she took docosahexaenoic acid (DHA) the night before.  ?  Event duration:?Unknown  ?  Event frequency:?One on 07/26/12  ?  Precipitating factors:?She did take the DHA the night before, but no other factors noted.  ?  Previous AEDs:??None  ?  Other history: I reviewed the ED note from [facility]. ?It was pertinent for a suspected diagnosis of seizure. ?By the time Ms. Mosby arrived at the hospital, she was back to her baseline. ?There was a brief episode of nausea that lasted 10 seconds as well.  ?  Epilepsy risk factors (including gestational/birth history):  The patient was the product of a normal spontaneous vaginal delivery of a full term birth. ?  There was no history of febrile convulsions or CNS infections. ?  Development was normal and developmental milestones were up to par with age. ?  No history of head trauma with loss of consciousness.   There are no other risk factors for epilepsy.        PAST MEDICAL HISTORY     Medical History:   Diagnosis Date   ? Cancer of colon (HCC)    ? Colon cancer [facility])     Colon s/p colectomy   ? Colon polyp    ? Epilepsy (HCC)    ? Seizure [facility])        Patient Active Problem List    Diagnosis Date Noted   ? Mixed stress and urge urinary incontinence 10/30/2019       PAST SURGICAL HISTORY     Surgical History:   Procedure Laterality Date   ? CATARACT REMOVAL Bilateral    ? CATARACT REMOVAL     ? GALLBLADDER SURGERY     ? HX APPENDECTOMY     ? HX  COLON SURGERY      CA removal       FAMILY HISTORY     Family History   Problem Relation Age of Onset   ? Cancer Mother    ? Coronary Artery Disease Mother    ? Cancer Father    ? Coronary Artery Disease Father    ? Stroke Father    ? Seizures Neg Hx    ? Dementia Neg Hx        SOCIAL HISTORY     Social History     Socioeconomic History   ? Marital status: Married   Tobacco Use   ? Smoking status: Never Smoker   ? Smokeless tobacco: Never Used   Substance and Sexual Activity   ? Alcohol use: Never   ? Drug use: Never       Social History     Substance and Sexual Activity   Drug Use Never         Social History     Tobacco Use   Smoking Status Never Smoker   Smokeless Tobacco Never Used       MEDICATION     Current Outpatient Medications   Medication Sig Dispense Refill   ? estradioL (ESTRACE) 0.01 % (0.1 mg/g) vaginal cream Insert or Apply  to vaginal area twice weekly. Insert pea-sized amount into vagina 2 nights a week. 42.5 g 1   ? levETIRAcetam XR (KEPPRA XR) 500 mg tablet Take one tablet by mouth daily. 90 tablet 3   ? losartan (COZAAR) 100 mg tablet Take 100 mg by mouth daily.     ? vit A/C/E ac/ZnOx/cupric oxide (EYE VITAMIN AND MINERALS PO) Take  by mouth.     ? vit A/vit C/vit E/zinc/copper (ICAPS AREDS PO) Take  by mouth daily.       No current facility-administered medications for this visit.       ALLERGIES     Allergies   Allergen Reactions   ? Seasonal Allergies AGITATION       PHYSICAL EXAMINATION     BP (!) 170/84  - Pulse 66  - Ht 154.9 cm (5' 1)  - Wt 71.7 kg (158 lb)  - BMI 29.85 kg/m?          PHYSICAL EXAM:    General:  ? SHANECA ORNE is a well appearing individual in no acute distress.   ? Head:  Head normocephalic, atraumatic.   ? Lungs:  Unlabored   ? Extremities:  No edema or clear skin lesions noted.   Neuro:   ? Mental status: Awake, attentive, and alert.   ? Patient oriented to self, place, and time.  ? Language has intact comprehension;   ? Language content appears appropriate based on conversation.   ? Pupils are equal and round  ? Gaze conjugate  ? Extraocular eye movements are full without nystagmus.   ? Face appears symmetric at rest  ? Hearing grossly intact.     ? Muscle bulk and tone appear normal  ? Moves all extremities   ? No adventitious movements.   ? Evaluation of gait: normal stance, arm swing, and stride length    STUDIES REVIEWED     No head imaging reviewed during this visit.       IMPRESSION/PLAN   Summary/Impression:  Alicia Kramer is a 86 y.o. female with undefined epilepsy well controlled with keppra.          Plan:      ?  Continue ASM regimen as follows:  ? Keppra XR 500 mg nightly   ? CBC, CMP  ? I encouraged her to f/u with her PCP for annual wellness exams   ? Follow up with myself  in 12 months or sooner if needed.        Approximately 30 minutes were spent with the patient/family member/cargiver of which more than 50% were spent in preparing to see the patient, counseling and educating the patient/family member/caregiver, and/or coordination of care on 02/24/21. The patient understands and agrees with the plan of care.      Electronically signed by  Dwana Melena, APRN-NP   Epilepsy APP  [facility]  [facility]  on 02/24/2021 at 9:38 AM.

## 2021-06-02 ENCOUNTER — Encounter: Admit: 2021-06-02 | Discharge: 2021-06-02 | Payer: MEDICARE

## 2021-06-02 ENCOUNTER — Ambulatory Visit: Admit: 2021-06-02 | Discharge: 2021-06-02 | Payer: MEDICARE

## 2021-06-02 DIAGNOSIS — K635 Polyp of colon: Secondary | ICD-10-CM

## 2021-06-02 DIAGNOSIS — R569 Unspecified convulsions: Secondary | ICD-10-CM

## 2021-06-02 DIAGNOSIS — G40909 Epilepsy, unspecified, not intractable, without status epilepticus: Secondary | ICD-10-CM

## 2021-06-02 DIAGNOSIS — C189 Malignant neoplasm of colon, unspecified: Secondary | ICD-10-CM

## 2021-09-01 ENCOUNTER — Encounter: Admit: 2021-09-01 | Discharge: 2021-09-01 | Payer: MEDICARE

## 2021-09-01 ENCOUNTER — Ambulatory Visit: Admit: 2021-09-01 | Discharge: 2021-09-01 | Payer: MEDICARE

## 2021-09-01 DIAGNOSIS — R569 Unspecified convulsions: Secondary | ICD-10-CM

## 2021-09-01 DIAGNOSIS — C189 Malignant neoplasm of colon, unspecified: Secondary | ICD-10-CM

## 2021-09-01 DIAGNOSIS — G40909 Epilepsy, unspecified, not intractable, without status epilepticus: Secondary | ICD-10-CM

## 2021-09-01 DIAGNOSIS — K635 Polyp of colon: Secondary | ICD-10-CM

## 2021-11-30 ENCOUNTER — Encounter: Admit: 2021-11-30 | Discharge: 2021-11-30 | Payer: MEDICARE

## 2021-11-30 ENCOUNTER — Ambulatory Visit: Admit: 2021-11-30 | Discharge: 2021-12-01 | Payer: MEDICARE

## 2021-11-30 DIAGNOSIS — K635 Polyp of colon: Secondary | ICD-10-CM

## 2021-11-30 DIAGNOSIS — G40909 Epilepsy, unspecified, not intractable, without status epilepticus: Secondary | ICD-10-CM

## 2021-11-30 DIAGNOSIS — R569 Unspecified convulsions: Secondary | ICD-10-CM

## 2021-11-30 DIAGNOSIS — C189 Malignant neoplasm of colon, unspecified: Secondary | ICD-10-CM

## 2021-11-30 DIAGNOSIS — N814 Uterovaginal prolapse, unspecified: Secondary | ICD-10-CM

## 2021-11-30 NOTE — Progress Notes
Ring with knob and support pessary was removed and cleaned, replaced with no problems or concerns.  No vaginal bleeding.    Performed by Lorina Rabon, RN.    Patient to be seen by Jerolyn Center, PA.

## 2022-02-25 ENCOUNTER — Encounter: Admit: 2022-02-25 | Discharge: 2022-02-25 | Payer: MEDICARE

## 2022-02-25 ENCOUNTER — Ambulatory Visit: Admit: 2022-02-25 | Discharge: 2022-02-26 | Payer: MEDICARE

## 2022-02-25 DIAGNOSIS — G40909 Epilepsy, unspecified, not intractable, without status epilepticus: Secondary | ICD-10-CM

## 2022-02-25 DIAGNOSIS — C189 Malignant neoplasm of colon, unspecified: Secondary | ICD-10-CM

## 2022-02-25 DIAGNOSIS — R569 Unspecified convulsions: Secondary | ICD-10-CM

## 2022-02-25 DIAGNOSIS — K635 Polyp of colon: Secondary | ICD-10-CM

## 2022-02-25 MED ORDER — LEVETIRACETAM 500 MG PO TB24
500 mg | ORAL_TABLET | Freq: Every day | ORAL | 3 refills | 90.00000 days | Status: AC
Start: 2022-02-25 — End: ?

## 2022-03-01 ENCOUNTER — Encounter: Admit: 2022-03-01 | Discharge: 2022-03-01 | Payer: MEDICARE

## 2022-03-01 ENCOUNTER — Ambulatory Visit: Admit: 2022-03-01 | Discharge: 2022-03-01 | Payer: MEDICARE

## 2022-03-01 DIAGNOSIS — K635 Polyp of colon: Secondary | ICD-10-CM

## 2022-03-01 DIAGNOSIS — G40909 Epilepsy, unspecified, not intractable, without status epilepticus: Secondary | ICD-10-CM

## 2022-03-01 DIAGNOSIS — C189 Malignant neoplasm of colon, unspecified: Secondary | ICD-10-CM

## 2022-03-01 DIAGNOSIS — R569 Unspecified convulsions: Secondary | ICD-10-CM

## 2022-03-01 NOTE — Progress Notes
Ring with support and knob pessary was removed and cleaned, replaced with no problems or concerns. No vaginal bleeding.     Performed by Donia Ast, RN      I was immediately available during the procedure.   Aletta Edouard, PA-C

## 2022-03-17 ENCOUNTER — Ambulatory Visit: Admit: 2022-03-17 | Discharge: 2022-03-17 | Payer: MEDICARE

## 2022-03-17 ENCOUNTER — Encounter: Admit: 2022-03-17 | Discharge: 2022-03-17 | Payer: MEDICARE

## 2022-03-17 DIAGNOSIS — R55 Syncope and collapse: Secondary | ICD-10-CM

## 2022-06-28 ENCOUNTER — Encounter: Admit: 2022-06-28 | Discharge: 2022-06-28 | Payer: MEDICARE

## 2022-06-28 ENCOUNTER — Ambulatory Visit: Admit: 2022-06-28 | Discharge: 2022-06-29 | Payer: MEDICARE

## 2022-06-28 DIAGNOSIS — K635 Polyp of colon: Secondary | ICD-10-CM

## 2022-06-28 DIAGNOSIS — C189 Malignant neoplasm of colon, unspecified: Secondary | ICD-10-CM

## 2022-06-28 DIAGNOSIS — N3946 Mixed incontinence: Secondary | ICD-10-CM

## 2022-06-28 DIAGNOSIS — N8110 Cystocele, unspecified: Secondary | ICD-10-CM

## 2022-06-28 DIAGNOSIS — G40909 Epilepsy, unspecified, not intractable, without status epilepticus: Secondary | ICD-10-CM

## 2022-06-28 DIAGNOSIS — R569 Unspecified convulsions: Secondary | ICD-10-CM

## 2022-06-28 NOTE — Progress Notes
Patient here for pessary change. Pessary removed washed and replace without difficulty. Patient tolerated well.

## 2022-09-27 ENCOUNTER — Encounter: Admit: 2022-09-27 | Discharge: 2022-09-27 | Payer: MEDICARE

## 2022-09-27 ENCOUNTER — Ambulatory Visit: Admit: 2022-09-27 | Discharge: 2022-09-28 | Payer: MEDICARE

## 2022-09-27 NOTE — Progress Notes
Patient came in for Pessary change. Pessary was removed without difficulty, washed, then replaced without difficulty. Patient tolerated well. Patient ambulatory and left in stable condition.   Preformed by [place] Kazuma Elena, RN

## 2022-12-27 ENCOUNTER — Encounter: Admit: 2022-12-27 | Discharge: 2022-12-27 | Payer: MEDICARE

## 2022-12-27 ENCOUNTER — Ambulatory Visit: Admit: 2022-12-27 | Discharge: 2022-12-27 | Payer: MEDICARE

## 2022-12-27 DIAGNOSIS — N814 Uterovaginal prolapse, unspecified: Secondary | ICD-10-CM

## 2022-12-27 NOTE — Progress Notes
Patient reported to clinic today for pessary change. Pessary was removed without difficulty, washed with soap and water, then replaced without difficulty. No vaginal bleeding noted. Patient tolerated well and was discharged in stable condition.

## 2023-02-11 ENCOUNTER — Encounter: Admit: 2023-02-11 | Discharge: 2023-02-11 | Payer: MEDICARE

## 2023-04-03 ENCOUNTER — Encounter: Admit: 2023-04-03 | Discharge: 2023-04-03 | Payer: MEDICARE

## 2023-04-04 ENCOUNTER — Encounter: Admit: 2023-04-04 | Discharge: 2023-04-04 | Payer: MEDICARE

## 2023-04-05 ENCOUNTER — Encounter: Admit: 2023-04-05 | Discharge: 2023-04-05 | Payer: MEDICARE

## 2023-04-05 ENCOUNTER — Ambulatory Visit: Admit: 2023-04-05 | Discharge: 2023-04-05 | Payer: MEDICARE

## 2023-04-05 DIAGNOSIS — N3946 Mixed incontinence: Secondary | ICD-10-CM

## 2023-04-05 DIAGNOSIS — N8110 Cystocele, unspecified: Secondary | ICD-10-CM

## 2023-04-05 MED ORDER — ESTRADIOL 0.01 % (0.1 MG/GRAM) VA CREA
1 g | VAGINAL | 3 refills | 30.00000 days | Status: AC
Start: 2023-04-05 — End: ?

## 2023-04-05 MED ORDER — ESTRADIOL 0.01 % (0.1 MG/GRAM) VA CREA
1 g | VAGINAL | 1 refills | 30.00000 days | Status: DC
Start: 2023-04-05 — End: 2023-04-05

## 2023-04-05 NOTE — Progress Notes
Date of Service: 04/05/2023     Subjective:             Alicia Kramer is a 88 y.o. female.    History of Present Illness  Alicia Kramer is a 88 y.o. female with PMH significant for epilepsy and colon cancer s/p colectomy presenting for follow-up of uterine prolapse w/ pessary change.     Today, 04/05/23- She is doing well. Continues to apply estrace 2x/wk. Denies pain w/ pessary, urinary leakage, frequent UTIs. No concerns.       Past Medical History:   Diagnosis Date    Cancer of colon (HCC)     Colon cancer (HCC)     Colon s/p colectomy    Colon polyp     Epilepsy (HCC)     Seizure (HCC)        Surgical History:   Procedure Laterality Date    CATARACT REMOVAL Bilateral     CATARACT REMOVAL      GALLBLADDER SURGERY      HX APPENDECTOMY      HX COLON SURGERY      CA removal       Family History   Problem Relation Name Age of Onset    Cancer Mother      Coronary Artery Disease Mother      Cancer Father      Coronary Artery Disease Father      Stroke Father      Seizures Neg Hx      Dementia Neg Hx         Current Outpatient Medications   Medication Sig Dispense Refill    estradioL (ESTRACE) 0.01 % (0.1 mg/g) vaginal cream Insert or Apply  to vaginal area twice weekly. Insert pea-sized amount into vagina 2 nights a week. 42.5 g 1    levETIRAcetam XR (KEPPRA XR) 500 mg tablet Take one tablet by mouth daily. 90 tablet 3    losartan (COZAAR) 100 mg tablet Take one tablet by mouth daily.      vit A/C/E ac/ZnOx/cupric oxide (EYE VITAMIN AND MINERALS PO) Take  by mouth.       No current facility-administered medications for this visit.       Allergies   Allergen Reactions    Seasonal Allergies AGITATION       Social History     Socioeconomic History    Marital status: Married   Tobacco Use    Smoking status: Never    Smokeless tobacco: Never   Substance and Sexual Activity    Alcohol use: Never    Drug use: Never    Sexual activity: Not Currently         Objective:          estradioL (ESTRACE) 0.01 % (0.1 mg/g) vaginal cream Insert or Apply  to vaginal area twice weekly. Insert pea-sized amount into vagina 2 nights a week.    levETIRAcetam XR (KEPPRA XR) 500 mg tablet Take one tablet by mouth daily.    losartan (COZAAR) 100 mg tablet Take one tablet by mouth daily.    vit A/C/E ac/ZnOx/cupric oxide (EYE VITAMIN AND MINERALS PO) Take  by mouth.     Vitals:    04/05/23 1000   BP: (!) 167/76   BP Source: Arm, Left Upper   Pulse: 75   Temp: 36.8 ?C (98.2 ?F)   TempSrc: Temporal   PainSc: Zero   Weight: 66.2 kg (146 lb)   Height: 154.9 cm (5' 1)  Body mass index is 27.59 kg/m?.     Gen: well-developed, NAD  HEENT: normocephalic, conjunctiva clear, mucous membranes moist  Respiratory: normal respiratory effort with symmetric chest expansion  CV: Palpable radial pulse, no cyanosis  GI: Soft, non tender, non distended, no palpable masses  Skin: Warm, No jaundice  Musculoskeletal: No edema, no gross deformity  Neuro: Awake, alert, no focal deficits, normal gait  Psychiatric: Normal affect           Assessment and Plan:  88 y/F w/ uterine prolapse managed w/ pessary             #4 pessary ring w/ support and knob removed, cleaned and replaced today without issue.    Plan:  - Cont vaginal estrogen - recommended increasing to 3x/wk   - Cont pessary cleanings every 3 months     Wallace Going, [place]  Urology

## 2023-04-05 NOTE — Progress Notes
#   4 ring with knob and support pessary removed without difficulty. Pessary cleaned and replaced without difficulty. No vaginal bleeding. Patient escorted from clinic in stable condition.

## 2023-04-06 ENCOUNTER — Encounter: Admit: 2023-04-06 | Discharge: 2023-04-06 | Payer: MEDICARE

## 2023-04-06 DIAGNOSIS — G40909 Epilepsy, unspecified, not intractable, without status epilepticus: Secondary | ICD-10-CM

## 2023-04-06 DIAGNOSIS — N8110 Cystocele, unspecified: Secondary | ICD-10-CM

## 2023-04-06 MED ORDER — LEVETIRACETAM 500 MG PO TB24
500 mg | ORAL_TABLET | Freq: Every day | ORAL | 1 refills | 90.00000 days | Status: AC
Start: 2023-04-06 — End: ?

## 2023-08-25 ENCOUNTER — Encounter: Admit: 2023-08-25 | Discharge: 2023-08-25

## 2023-08-25 ENCOUNTER — Ambulatory Visit: Admit: 2023-08-25 | Discharge: 2023-08-26

## 2023-08-25 DIAGNOSIS — N3946 Mixed incontinence: Secondary | ICD-10-CM

## 2023-08-25 DIAGNOSIS — N8110 Cystocele, unspecified: Secondary | ICD-10-CM

## 2023-08-25 NOTE — Progress Notes
 Date of Service: 08/25/2023     Subjective:             Alicia Kramer is a 89 y.o. female.    History of Present Illness  Alicia Kramer is a 89 y.o. female with a history of pelvic organ prolapse managed with a pessary, presents for a pessary change.     Her last pessary change was in November. She reports having to cancel her previous pessary change appointments d/t the weather. She has been applying Estrace two times per week. Today, she reports no new or worsening symptoms.           Past Medical History:    Cancer of colon (CMS-HCC)    Colon cancer (CMS-HCC)    Colon polyp    Epilepsy (CMS-HCC)    Seizure (CMS-HCC)       Surgical History:   Procedure Laterality Date    CATARACT REMOVAL Bilateral     CATARACT REMOVAL      GALLBLADDER SURGERY      HX APPENDECTOMY      HX COLON SURGERY      CA removal       Family History   Problem Relation Name Age of Onset    Cancer Mother      Coronary Artery Disease Mother      Cancer Father      Coronary Artery Disease Father      Stroke Father      Seizures Neg Hx      Dementia Neg Hx         Current Outpatient Medications   Medication Sig Dispense Refill    estradioL (ESTRACE) 0.01 % (0.1 mg/g) vaginal cream Insert or Apply one g to vaginal area three times weekly. 43 g 3    levETIRAcetam XR (KEPPRA XR) 500 mg tablet Take 1 tablet by mouth once daily 90 tablet 1    losartan (COZAAR) 100 mg tablet Take one tablet by mouth daily.      vit A/C/E ac/ZnOx/cupric oxide (EYE VITAMIN AND MINERALS PO) Take  by mouth.       No current facility-administered medications for this visit.       Allergies   Allergen Reactions    Seasonal Allergies AGITATION       Social History     Socioeconomic History    Marital status: Married   Tobacco Use    Smoking status: Never    Smokeless tobacco: Never   Substance and Sexual Activity    Alcohol use: Never    Drug use: Never    Sexual activity: Not Currently         Objective:          estradioL (ESTRACE) 0.01 % (0.1 mg/g) vaginal cream Insert or Apply one g to vaginal area three times weekly.    levETIRAcetam XR (KEPPRA XR) 500 mg tablet Take 1 tablet by mouth once daily    losartan (COZAAR) 100 mg tablet Take one tablet by mouth daily.    vit A/C/E ac/ZnOx/cupric oxide (EYE VITAMIN AND MINERALS PO) Take  by mouth.     Vitals:    08/25/23 0753   BP: (!) 145/72   BP Source: Arm, Right Upper   Pulse: 73   Temp: 36.6 ?C (97.9 ?F)   Resp: 16   SpO2: 97%   TempSrc: Temporal   PainSc: Zero     There is no height or weight on file to calculate BMI.  Gen: well-developed, NAD  HEENT: normocephalic, conjunctiva clear, mucous membranes moist  Respiratory: normal respiratory effort with symmetric chest expansion  CV: Palpable radial pulse, no cyanosis  GI: Soft, non tender, non distended, no palpable masses  Skin: Warm, No jaundice  Musculoskeletal: No edema, no gross deformity  Neuro: Awake, alert, no focal deficits, normal gait  Psychiatric: Normal affect  GU:   Vaginal erythema and bleeding. No ulcerations/tissue breakdown.      Pessary removed. Vaginal tissue inspected. Pessary cleaned.          Assessment and Plan:                Pelvic organ prolapse  Vaginal erythema and bleeding noted. No vaginal ulceration or signs of fistula. Reviewed importance of three-month pessary changes to prevent complications. Discussed one-month pessary removal for tissue recovery.  - Keep pessary out for one month.  - Continue vaginal Estrace three times weekly.  - Follow up in one month for pelvic exam and pessary placement.  - Educated on contacting vs. ER precautions if experiencing incomplete emptying symptoms.           Patti Border, PA-C  Urology

## 2023-09-29 ENCOUNTER — Encounter: Admit: 2023-09-29 | Discharge: 2023-09-29 | Payer: MEDICARE

## 2023-09-29 ENCOUNTER — Ambulatory Visit: Admit: 2023-09-29 | Discharge: 2023-09-30 | Payer: MEDICARE

## 2023-09-29 MED ORDER — ESTRADIOL 0.01 % (0.1 MG/GRAM) VA CREA
1 g | VAGINAL | 3 refills | 30.00000 days | Status: AC
Start: 2023-09-29 — End: ?
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
## 2023-09-30 DIAGNOSIS — N8110 Cystocele, unspecified: Secondary | ICD-10-CM

## 2023-10-13 ENCOUNTER — Encounter: Admit: 2023-10-13 | Discharge: 2023-10-13 | Payer: MEDICARE

## 2023-10-13 DIAGNOSIS — G40909 Epilepsy, unspecified, not intractable, without status epilepticus: Secondary | ICD-10-CM

## 2023-10-13 MED ORDER — LEVETIRACETAM 500 MG PO TB24
500 mg | ORAL_TABLET | Freq: Every day | ORAL | 0 refills | 90.00000 days | Status: AC
Start: 2023-10-13 — End: ?

## 2024-01-02 ENCOUNTER — Encounter: Admit: 2024-01-02 | Discharge: 2024-01-02 | Payer: MEDICARE

## 2024-01-03 ENCOUNTER — Encounter: Admit: 2024-01-03 | Discharge: 2024-01-03 | Payer: MEDICARE

## 2024-01-03 ENCOUNTER — Ambulatory Visit: Admit: 2024-01-03 | Discharge: 2024-01-04 | Payer: MEDICARE

## 2024-01-03 DIAGNOSIS — N814 Uterovaginal prolapse, unspecified: Secondary | ICD-10-CM

## 2024-01-03 DIAGNOSIS — N3946 Mixed incontinence: Principal | ICD-10-CM
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
## 2024-01-19 ENCOUNTER — Encounter: Admit: 2024-01-19 | Discharge: 2024-01-19 | Payer: MEDICARE

## 2024-01-19 DIAGNOSIS — G40909 Epilepsy, unspecified, not intractable, without status epilepticus: Principal | ICD-10-CM

## 2024-01-19 MED ORDER — LEVETIRACETAM 500 MG PO TB24
500 mg | ORAL_TABLET | Freq: Every day | ORAL | 0 refills | 90.00000 days | Status: AC
Start: 2024-01-19 — End: ?

## 2024-01-19 NOTE — Telephone Encounter
 Patient last saw provider on 02/25/2022. Patient is not yetscheduled for follow up appointment, LM for pt to schedule, pt not active in Mychart since 2020.  Will send courtesy refill until pt can be scheduled for follow up. Per clinical note continue Keppra  XR 500 mg nightly .

## 2024-02-21 ENCOUNTER — Encounter: Admit: 2024-02-21 | Discharge: 2024-02-21 | Payer: MEDICARE

## 2024-02-21 DIAGNOSIS — G40909 Epilepsy, unspecified, not intractable, without status epilepticus: Principal | ICD-10-CM

## 2024-02-21 MED ORDER — LEVETIRACETAM 500 MG PO TB24
500 mg | ORAL_TABLET | Freq: Every day | ORAL | 3 refills | 90.00000 days | Status: AC
Start: 2024-02-21 — End: ?

## 2024-02-21 NOTE — Telephone Encounter
 Patient last saw provider on 02/25/22. Patient is scheduled for follow up appointment on 05/10/24. Per clinical note continue Keppra  XR 500 mg nightly .

## 2024-03-06 ENCOUNTER — Encounter: Admit: 2024-03-06 | Discharge: 2024-03-06 | Payer: MEDICARE

## 2024-03-06 ENCOUNTER — Ambulatory Visit: Admit: 2024-03-06 | Discharge: 2024-03-07 | Payer: MEDICARE

## 2024-03-06 DIAGNOSIS — N8110 Cystocele, unspecified: Secondary | ICD-10-CM
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
## 2024-04-24 ENCOUNTER — Ambulatory Visit: Admit: 2024-04-24 | Discharge: 2024-04-25 | Payer: MEDICARE

## 2024-04-24 ENCOUNTER — Encounter: Admit: 2024-04-24 | Discharge: 2024-04-24 | Payer: MEDICARE

## 2024-04-24 DIAGNOSIS — N814 Uterovaginal prolapse, unspecified: Secondary | ICD-10-CM

## 2024-04-24 DIAGNOSIS — N3946 Mixed incontinence: Principal | ICD-10-CM

## 2024-04-24 DIAGNOSIS — N958 Other specified menopausal and perimenopausal disorders: Secondary | ICD-10-CM

## 2024-04-24 MED ORDER — ESTRING 2 MG (7.5 MCG /24 HOUR) VA RING
1 | VAGINAL_RING | VAGINAL | 3 refills | 30.00000 days | Status: AC
Start: 2024-04-24 — End: ?
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
## 2024-04-25 ENCOUNTER — Encounter: Admit: 2024-04-25 | Discharge: 2024-04-25 | Payer: MEDICARE

## 2024-04-25 NOTE — Telephone Encounter [36]
 Patient called to verify which pharmacy Estring  was sent to.  [facility] pharmacy noted and updated patient.  Patient is unsure if she can come to clinic in the winter.  She drives herself.  Discussed alternative options, patient could place herself, if able or patient could reach out to her PCP in town to see if they could place.  Patient encouraged to contact clinic if she needed assistance placing Estring  and could find a driver.

## 2024-04-27 ENCOUNTER — Encounter: Admit: 2024-04-27 | Discharge: 2024-04-27 | Payer: MEDICARE

## 2024-04-27 NOTE — Telephone Encounter [36]
 Patient has picked up her Estring  at the pharmacy.  Scheduled an appointment for her to come to clinic to have it placed.

## 2024-05-01 ENCOUNTER — Encounter: Admit: 2024-05-01 | Discharge: 2024-05-01 | Payer: MEDICARE

## 2024-05-01 ENCOUNTER — Ambulatory Visit: Admit: 2024-05-01 | Discharge: 2024-05-02 | Payer: MEDICARE

## 2024-05-01 VITALS — BP 146/79 | HR 74 | Temp 97.90000°F | Ht 61.0 in | Wt 149.0 lb

## 2024-05-01 DIAGNOSIS — N814 Uterovaginal prolapse, unspecified: Principal | ICD-10-CM

## 2024-05-01 NOTE — Progress Notes [1]
 New prescription Estring  placed without issue. Patient tolerated procedure well.     Performed by Sari Rafter, RN
